# Patient Record
Sex: Female | Born: 1980 | Race: White | Hispanic: No | Marital: Married | Smoking: Never smoker
Health system: Southern US, Community
[De-identification: ages and names within clinical notes are randomized; demographics above are authoritative.]

## PROBLEM LIST (undated history)

## (undated) DIAGNOSIS — K279 Peptic ulcer, site unspecified, unspecified as acute or chronic, without hemorrhage or perforation: Secondary | ICD-10-CM

## (undated) HISTORY — PX: TYMPANOSTOMY TUBE PLACEMENT: SHX32

## (undated) HISTORY — PX: TONSILECTOMY/ADENOIDECTOMY WITH MYRINGOTOMY: SHX6125

## (undated) HISTORY — PX: TONSILLECTOMY: SUR1361

## (undated) HISTORY — PX: TUBAL LIGATION: SHX77

---

## 2006-03-14 ENCOUNTER — Emergency Department (HOSPITAL_COMMUNITY): Admission: EM | Admit: 2006-03-14 | Discharge: 2006-03-14 | Payer: Self-pay | Admitting: Emergency Medicine

## 2006-08-27 ENCOUNTER — Inpatient Hospital Stay (HOSPITAL_COMMUNITY): Admission: AD | Admit: 2006-08-27 | Discharge: 2006-09-01 | Payer: Self-pay | Admitting: *Deleted

## 2006-08-27 ENCOUNTER — Emergency Department (HOSPITAL_COMMUNITY): Admission: EM | Admit: 2006-08-27 | Discharge: 2006-08-27 | Payer: Self-pay | Admitting: Emergency Medicine

## 2006-08-28 ENCOUNTER — Ambulatory Visit: Payer: Self-pay | Admitting: *Deleted

## 2007-11-21 ENCOUNTER — Emergency Department (HOSPITAL_COMMUNITY): Admission: EM | Admit: 2007-11-21 | Discharge: 2007-11-21 | Payer: Self-pay | Admitting: Emergency Medicine

## 2008-07-26 ENCOUNTER — Other Ambulatory Visit: Payer: Self-pay

## 2008-07-26 ENCOUNTER — Other Ambulatory Visit: Payer: Self-pay | Admitting: Emergency Medicine

## 2008-07-27 ENCOUNTER — Other Ambulatory Visit: Payer: Self-pay | Admitting: Emergency Medicine

## 2008-07-27 ENCOUNTER — Ambulatory Visit: Payer: Self-pay | Admitting: Psychiatry

## 2008-07-28 ENCOUNTER — Inpatient Hospital Stay (HOSPITAL_COMMUNITY): Admission: RE | Admit: 2008-07-28 | Discharge: 2008-07-30 | Payer: Self-pay | Admitting: Psychiatry

## 2008-09-12 ENCOUNTER — Other Ambulatory Visit (HOSPITAL_COMMUNITY): Payer: Self-pay | Admitting: Emergency Medicine

## 2008-09-12 ENCOUNTER — Inpatient Hospital Stay (HOSPITAL_COMMUNITY): Admission: AD | Admit: 2008-09-12 | Discharge: 2008-09-14 | Payer: Self-pay | Admitting: Psychiatry

## 2008-09-12 ENCOUNTER — Ambulatory Visit: Payer: Self-pay | Admitting: Psychiatry

## 2008-09-20 ENCOUNTER — Other Ambulatory Visit: Payer: Self-pay

## 2008-09-21 ENCOUNTER — Inpatient Hospital Stay (HOSPITAL_COMMUNITY): Admission: EM | Admit: 2008-09-21 | Discharge: 2008-09-23 | Payer: Self-pay | Admitting: Psychiatry

## 2008-10-23 ENCOUNTER — Emergency Department (HOSPITAL_COMMUNITY): Admission: EM | Admit: 2008-10-23 | Discharge: 2008-10-23 | Payer: Self-pay | Admitting: Emergency Medicine

## 2009-10-19 ENCOUNTER — Emergency Department: Payer: Self-pay | Admitting: Emergency Medicine

## 2009-10-21 ENCOUNTER — Emergency Department: Payer: Self-pay | Admitting: Emergency Medicine

## 2009-12-15 ENCOUNTER — Emergency Department: Payer: Self-pay | Admitting: Emergency Medicine

## 2010-03-31 ENCOUNTER — Emergency Department: Payer: Self-pay | Admitting: Internal Medicine

## 2010-04-10 ENCOUNTER — Observation Stay: Payer: Self-pay | Admitting: Internal Medicine

## 2010-04-11 ENCOUNTER — Inpatient Hospital Stay: Payer: Self-pay | Admitting: Psychiatry

## 2010-04-11 LAB — CBC
HCT: 32.8 % — ABNORMAL LOW (ref 36.0–46.0)
Hemoglobin: 11 g/dL — ABNORMAL LOW (ref 12.0–15.0)
MCV: 91.5 fL (ref 78.0–100.0)
RBC: 3.59 MIL/uL — ABNORMAL LOW (ref 3.87–5.11)
WBC: 7.6 10*3/uL (ref 4.0–10.5)

## 2010-04-11 LAB — ETHANOL: Alcohol, Ethyl (B): <5 mg/dL (ref 0–10)

## 2010-04-11 LAB — BASIC METABOLIC PANEL
Chloride: 105 mEq/L (ref 96–112)
GFR calc Af Amer: >60 mL/min (ref >60)
Potassium: 4 mEq/L (ref 3.5–5.1)
Sodium: 140 mEq/L (ref 135–145)

## 2010-04-11 LAB — DIFFERENTIAL
Eosinophils Absolute: 0.1 10*3/uL (ref 0.0–0.7)
Eosinophils Relative: 1 % (ref 0–5)
Lymphocytes Relative: 31 % (ref 12–46)
Lymphs Abs: 2.4 10*3/uL (ref 0.7–4.0)
Monocytes Relative: 7 % (ref 3–12)

## 2010-04-11 LAB — RAPID URINE DRUG SCREEN, HOSP PERFORMED: Cocaine: NOT DETECTED

## 2010-04-12 LAB — HEPATIC FUNCTION PANEL
ALT: 11 U/L (ref 0–35)
AST: 16 U/L (ref 0–37)
Alkaline Phosphatase: 52 U/L (ref 39–117)
Bilirubin, Direct: 0.1 mg/dL (ref 0.0–0.3)
Indirect Bilirubin: 0.6 mg/dL (ref 0.3–0.9)

## 2010-04-12 LAB — DIFFERENTIAL
Basophils Absolute: 0.2 10*3/uL — ABNORMAL HIGH (ref 0.0–0.1)
Basophils Relative: 2 % — ABNORMAL HIGH (ref 0–1)
Eosinophils Absolute: 0.1 10*3/uL (ref 0.0–0.7)
Eosinophils Absolute: 0.1 10*3/uL (ref 0.0–0.7)
Eosinophils Relative: 1 % (ref 0–5)
Eosinophils Relative: 1 % (ref 0–5)
Lymphocytes Relative: 27 % (ref 12–46)
Lymphocytes Relative: 39 % (ref 12–46)
Lymphs Abs: 3 10*3/uL (ref 0.7–4.0)
Monocytes Absolute: 0.4 10*3/uL (ref 0.1–1.0)
Monocytes Relative: 6 % (ref 3–12)

## 2010-04-12 LAB — RAPID URINE DRUG SCREEN, HOSP PERFORMED
Amphetamines: NOT DETECTED
Barbiturates: NOT DETECTED
Barbiturates: NOT DETECTED
Benzodiazepines: POSITIVE — AB
Benzodiazepines: POSITIVE — AB
Tetrahydrocannabinol: POSITIVE — AB

## 2010-04-12 LAB — TRICYCLICS SCREEN, URINE: TCA Scrn: NOT DETECTED

## 2010-04-12 LAB — CBC
HCT: 33.3 % — ABNORMAL LOW (ref 36.0–46.0)
HCT: 33.5 % — ABNORMAL LOW (ref 36.0–46.0)
Hemoglobin: 11.1 g/dL — ABNORMAL LOW (ref 12.0–15.0)
MCHC: 32.9 g/dL (ref 30.0–36.0)
MCV: 91.4 fL (ref 78.0–100.0)
Platelets: 231 10*3/uL (ref 150–400)
Platelets: 244 10*3/uL (ref 150–400)
RBC: 3.66 MIL/uL — ABNORMAL LOW (ref 3.87–5.11)
RDW: 14.5 % (ref 11.5–15.5)
WBC: 7.7 10*3/uL (ref 4.0–10.5)

## 2010-04-12 LAB — BASIC METABOLIC PANEL
BUN: 10 mg/dL (ref 6–23)
CO2: 28 mEq/L (ref 19–32)
Chloride: 105 mEq/L (ref 96–112)
GFR calc Af Amer: >60 mL/min (ref >60)
GFR calc non Af Amer: >60 mL/min (ref >60)
GFR calc non Af Amer: >60 mL/min (ref >60)
Glucose, Bld: 102 mg/dL — ABNORMAL HIGH (ref 70–99)
Potassium: 3.8 mEq/L (ref 3.5–5.1)
Potassium: 4 mEq/L (ref 3.5–5.1)
Sodium: 139 mEq/L (ref 135–145)

## 2010-04-12 LAB — ETHANOL: Alcohol, Ethyl (B): <5 mg/dL (ref 0–10)

## 2010-04-14 LAB — DIFFERENTIAL
Basophils Relative: 0 % (ref 0–1)
Eosinophils Relative: 1 % (ref 0–5)
Lymphocytes Relative: 19 % (ref 12–46)
Lymphs Abs: 1.6 10*3/uL (ref 0.7–4.0)
Monocytes Absolute: 0.5 10*3/uL (ref 0.1–1.0)
Monocytes Absolute: 0.5 10*3/uL (ref 0.1–1.0)
Monocytes Relative: 6 % (ref 3–12)
Monocytes Relative: 5 % (ref 3–12)
Neutro Abs: 6.4 10*3/uL (ref 1.7–7.7)
Neutro Abs: 9 10*3/uL — ABNORMAL HIGH (ref 1.7–7.7)
Neutrophils Relative %: 75 % (ref 43–77)

## 2010-04-14 LAB — RAPID URINE DRUG SCREEN, HOSP PERFORMED
Cocaine: NOT DETECTED
Cocaine: NOT DETECTED
Opiates: POSITIVE — AB
Tetrahydrocannabinol: POSITIVE — AB
Tetrahydrocannabinol: POSITIVE — AB

## 2010-04-14 LAB — CBC
HCT: 34.2 % — ABNORMAL LOW (ref 36.0–46.0)
Hemoglobin: 11.7 g/dL — ABNORMAL LOW (ref 12.0–15.0)
Hemoglobin: 11.1 g/dL — ABNORMAL LOW (ref 12.0–15.0)
MCHC: 32.3 g/dL (ref 30.0–36.0)
MCV: 91.1 fL (ref 78.0–100.0)
RBC: 3.85 MIL/uL — ABNORMAL LOW (ref 3.87–5.11)
RBC: 3.75 MIL/uL — ABNORMAL LOW (ref 3.87–5.11)
WBC: 8.6 10*3/uL (ref 4.0–10.5)

## 2010-04-14 LAB — BASIC METABOLIC PANEL
CO2: 26 mEq/L (ref 19–32)
Calcium: 9.5 mg/dL (ref 8.4–10.5)
Calcium: 9.1 mg/dL (ref 8.4–10.5)
Chloride: 103 mEq/L (ref 96–112)
Chloride: 103 mEq/L (ref 96–112)
Creatinine, Ser: 0.44 mg/dL (ref 0.4–1.2)
GFR calc Af Amer: >60 mL/min (ref >60)
GFR calc Af Amer: >60 mL/min (ref >60)
Potassium: 4.5 mEq/L (ref 3.5–5.1)
Sodium: 138 mEq/L (ref 135–145)
Sodium: 136 mEq/L (ref 135–145)

## 2010-04-14 LAB — TRICYCLICS SCREEN, URINE: TCA Scrn: NOT DETECTED

## 2010-04-14 LAB — ETHANOL: Alcohol, Ethyl (B): <5 mg/dL (ref 0–10)

## 2010-05-21 NOTE — H&P (Signed)
[name], [name]                ACCOUNT NO.:  [id number]   MEDICAL RECORD NO.:  [id number]          PATIENT TYPE:  IPS   LOCATION:  0504                          FACILITY:  [facility]   PHYSICIAN:  Geoffery Lyons, M.D.      DATE OF BIRTH:  [birth year]   DATE OF ADMISSION:  07/27/2008  DATE OF DISCHARGE:                       PSYCHIATRIC ADMISSION ASSESSMENT   TIME:  1410 p.m.   IDENTIFYING INFORMATION:  A 30 year old female, married.  This is a  voluntary admission.   HISTORY OF PRESENT ILLNESS:  Second [facility] admission for this 30 year old  who requests detox from opiates.  She reports getting involved in taking  opiate medication by snorting pills and taking them orally about 4 years  ago as a social activity.  She felt like she could not stop using them  and needed more and more to get the same effect.  Has been using fairly  steady in some fashion for the past 2-3 years.  Most recently, she is  using two 25 mg fentanyl patches which she will wear for 3 days at a  time and using these for the past 5 months.  In addition to the patches,  she will typically snort 10-15 tablets of Percocet and/or Vicodin  most  days of the week.  On some days when she has bad cravings, will use a  little bit more than 20 tablets.  She denies current or past abuse of  alcohol, benzodiazepines,  tobacco or cocaine.  No history of IV drug  abuse.  She denies suicidal thoughts.  Motivated to be abstinent to be a  better mother to her 2-month-old daughter.  No homicidal thoughts.   PAST PSYCHIATRIC HISTORY:  Second [facility] admission.  One prior admission  August 27, 2006 to September 01, 2006 after she took an overdose of 28  tablets of 25 mg hydroxyzine.  Angry and upset.  At that time, she was  treated with Depakote ER 250 mg in the morning and at bedtime, and  referred to [facility] for followup.  At  that time, she also endorsed abusing benzodiazepines using 10-30 mg per  day.  At that  time, had also been abusing Percocet.  She revealed an  additional suicide attempt by overdose at age 35.  No current outpatient  care.   SOCIAL HISTORY:  Married 6 years, has a 56-month-old daughter named  Preston.  Currently living in [place].  Has Medicaid  resources to pay for care.  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  Husband is nondrinker, nonsmoker.  No history of abuse.  She plans to remain home with her daughter and avoid previous social  contacts to reinforce abstinence.  No legal problems.   FAMILY HISTORY:  Mother with history of cannabis abuse.  Father with  history of cocaine abuse.   ALCOHOL/DRUG HISTORY:  Denies any past history of alcohol abuse.  She is  a nonsmoker.   MEDICAL HISTORY:  Followed at [facility].  Medical problems are none.   PAST MEDICAL HISTORY:  1. Significant for C. section.  2. Two prior suicide attempts by overdose.  3. Bilateral tubal ligation.   CURRENT MEDICATIONS:  None.   DRUG ALLERGIES:  NONE.   PHYSICAL EXAMINATION:  Done in the emergency room and is noted in the  record.  She was in our emergency room from July 25, 2008 to July 27, 2008 waiting bed placement.   LABORATORY DATA:  Urine drug screen positive for benzodiazepines,  opiates and marijuana.  Basic chemistry is normal.  BUN 6, creatinine  0.59.  Alcohol level less than 5.  CBC:  WBC 11.3, hemoglobin 11.1,  hematocrit 34.2, platelets 277,000 and MCV 91.9.  Urine pregnancy test  negative.   MENTAL STATUS EXAM:  Reveals a fully alert female, tearful, anxious in  appearance, but fully coherent.  Good eye contact.  Speech is normal.  Gives a coherent history.  Thought process is logical.  Mood is anxious.  No active suicidal thoughts.  Oriented x4.  No thought disorder.  No  evidence of internal distractions.  Cognitively, she is completely  intact.  Memory is intact.  Has made arrangements with her husband to   reduce the relapse triggers, getting rid of the job.  Wants him to come  to a family session.  Wants to pursue outpatient counseling and be  active in NA.   AXIS I:  Opiate abuse.  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  Opiate abuse rule out dependence.  Polysubstance abuse.  Axis II.  No diagnosis.  AXIS III:  No diagnosis.  AXIS IV:  Severe issues with the social environment.  AXIS V:  Current 46, past year not known.   PLAN:  The plan is to voluntarily admit her to our dual diagnosis unit.  To be on the safe side, we are going to also place her on a Librium  protocol to safely step her down from benzodiazepines.  She has a  history of dependence on these.  Also on a clonidine protocol to safely  detox her from the opiates.  She is anxious to get a family session with  her husband scheduled and we will do that today.  We will check liver  enzymes and a TSH.      Margaret A. Scott, N.P.      Geoffery Lyons, M.D.  Electronically Signed    MAS/MEDQ  D:  07/28/2008  T:  07/28/2008  Job:  [id number]

## 2010-05-21 NOTE — H&P (Signed)
[name], [name]                ACCOUNT NO.:  [id number]   MEDICAL RECORD NO.:  [id number]          PATIENT TYPE:  IPS   LOCATION:  0604                          FACILITY:  [facility]   PHYSICIAN:  Jasmine Pang, M.D. DATE OF BIRTH:  [birth year]   DATE OF ADMISSION:  08/27/2006  DATE OF DISCHARGE:                       PSYCHIATRIC ADMISSION ASSESSMENT   IDENTIFICATION:  This is a 30 year old white female who is married.  This is an involuntary admission.   HISTORY OF PRESENT ILLNESS:  First inpatient psychiatric admission for  this married 30 year old who presented in the emergency room after  taking 28 tablets of 25 mg hydroxyzine.  She said that she was angry and  upset and wanted to herself at the time.  Took the overdose about 2  o'clock in the afternoon.  These are medications that her husband uses  to help with sleep.  She reports that she had been upset and agitated  over her relationship with her mother, mother not speaking to her, has  been having a general chronic discord with her mother and her sisters  who tend to be chronically aloof and angry with the patient.  The  patient herself reports a history of mood swings with anger, decreased  frustration tolerance, getting irritable for periods that last as long  as a day or longer, having suicidal thoughts on and off for the past  three months and having suicidal thoughts occurring as early in life as  when she was a teenager at which time she would frequently write suicide  nodes and had frequent thoughts of suicide.  She currently reports that  she has been abusing Valium now for about three months, usually a 10 mg  tablet, using up to 1-3 tablets most days of the week, crushing and  snorting them.  Last use two days ago.  Also abusing Percocet about 5-9  tablets most days of the week, also crushing and snorting those along  with the Valium.  Was introduced to these by a friend of hers and has  been taking them to calm down.   She denies hallucinations.  Denies any  homicidal thoughts.  Denies abuse of alcohol or other substances.   PAST PSYCHIATRIC HISTORY:  First inpatient psychiatric admission.  The  patient does have a history of prior suicide attempt by overdose at age  84.  Was treated in the emergency room at that time and discharged after  signing a safety contract.  History of suicidal thoughts since teen.  Also had issues with anger and fighting as a child and spontaneous anger  as a teenager.  At that time, she was never treated with medications but  did receive counseling for anger and agitation.  She denies any history  of brain injury, blackout, coma or amnesia.  Denies learning  disabilities.   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  She is  currently  married for the past six years.  No children.  She is the  primary bread winner in the family and her husband is handicapped and  receives a disability check.  The patient reports that her relationships  with the husband's family are good and sees more of them than of her own  family.  No legal charges.   FAMILY HISTORY:  Family history is remarkable for two sisters, one with  alcoholism, one with bipolar disorder.  Mother with a history of bipolar  disorder, currently under treatment.   ALCOHOL/DRUG HISTORY:  Noted above.   MEDICAL HISTORY:  The patient has no regular primary care Sherleen Pangborn.  Medical problems are none.   MEDICATIONS:  None.   ALLERGIES:  None.   POSITIVE PHYSICAL FINDINGS:  The patient's full physical exam was done  in the emergency room at [facility] and is noted there.  She  remained fully conscious after the overdose and was transported by her  husband to the emergency room on presentation there.  Noted physical  findings today are  irregular heart rate and the patient herself denies  any palpitations, shortness of breath or post nocturnal dyspnea.  On  presentation, well-nourished, well-developed female in no distress, slim  build, appears healthy, afebrile, pulse 93, respirations 23, blood  pressure 113/74.   LABORATORY DATA:  CBC revealed WBC 6.8, hemoglobin 11.5, hematocrit 34.2  and platelets 206,000.  MCV 85.7.  Her urine drug screen was positive  for benzodiazepines.  Negative for all other substances.  Urine  pregnancy test negative.  Routine urinalysis unremarkable.  Alcohol  level less than 5.  Her chemistries revealed sodium 138, potassium 3.8,  chloride 111, carbon dioxide 24, BUN 9, creatinine 0.54 and random  glucose 103.  Liver enzymes and TSH are currently pending.  Her  acetaminophen level was less than 10.  Salicylate level less than 4.  EKG noted a slight prolongation but normal sinus rhythm.   MENTAL STATUS EXAM:  Today, fully alert female, cooperative.  Hygiene  and dress are appropriate.  Affect is quite blunted with some flattening  and minimal speech production but otherwise normal tone and pace,  relevant, appropriate.  She is able to express herself.  Clearly  articulate about her concerns particularly her depressed mood and  frequency of suicidal thoughts.  Has never been treated for this and is  definitely interested in treatment, feels that the suicidal thoughts and  mood swings have plagued her since childhood and is anxious for some  relief.  Thought process logical and coherent.  No evidence of  psychosis.  Positive suicidal ideations of poisoning or overdosing  herself.  No homicidal thought.  Cognition is well-preserved.  Calculation and concentration are within normal limits.  Insight  adequate.   DIAGNOSES:  AXIS I:  Mood disorder not otherwise specified.  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  Opiate abuse; rule out  dependence.  AXIS II:  Deferred.  AXIS III:  Status  post hydroxyzine overdose.  AXIS IV:  Moderate (chronic conflict with mother and sisters, having is  stable home and supportive husband is an asset to her).  AXIS V:  Current 29; past year not known.   PLAN:  To involuntarily admit the patient.  We have placed her in a dual  diagnosis program.  She has been cooperative with staff and peers.  We  are going to start her on Depakote ER 500 mg h.s. p.r.n. insomnia,  trazodone 50 mg h.s. p.r.n.  insomnia, Librium protocol to detox from the  benzodiazepines and a clonidine protocol for detox  from the Percocet.  Meanwhile, we are going to check hepatic function  panel and TSH.  The patient is in agreement with the plan.  She has no  history of seizures.   ESTIMATED LENGTH OF STAY:  Five to seven days.      Margaret A. Lorin Picket, N.P.      Jasmine Pang, M.D.  Electronically Signed    MAS/MEDQ  D:  08/28/2006  T:  08/29/2006  Job:  [id number]
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
## 2010-05-24 NOTE — Discharge Summary (Signed)
[name], [name]                ACCOUNT NO.:  [id number]   MEDICAL RECORD NO.:  [id number]          PATIENT TYPE:  IPS   LOCATION:  0504                          FACILITY:  [facility]   PHYSICIAN:  Geoffery Lyons, M.D.      DATE OF BIRTH:  March 27, 1980   DATE OF ADMISSION:  07/28/2008  DATE OF DISCHARGE:  07/30/2008                               DISCHARGE SUMMARY   CHIEF COMPLAINT AND PRESENT ILLNESS:  This was the 2nd admission to  [facility] for this 30 year old, married female  voluntarily admitted.  She requests detox from opiates.  She reports she  had gotten involved in taking opiates by snorting them and taking them  orally about 4 years ago as a social activity.  She felt like she could  not stop using them and needed more and more to get the same effect.  She has been using fairly steady for the past 2 or 3 years, most  recently using two 25 mg fentanyl patches which she will wear for 3 days  at time and she will snort 10 to 15 tablets of Percocet and/or Vicodin  most days of the week.  Sometimes she uses a little bit more than 20  tablets.  Was motivated to be abstinent to be a better mother to her 84-  month-old daughter.   PAST PSYCHIATRIC HISTORY:  Second time at [facility], one prior  admission August 27, 2006, to September 01, 2006.  At that time, she took  an overdose of 28 capsules of hydroxyzine 25 mg.  At that time, she was  treated with Depakote and referred to [facility].  She was also in 2008 abusing benzodiazepines, also abusing  Percocet.  An additional suicide attempt by overdose age 89.   ALCOHOL AND DRUG HISTORY:  As already stated, persistent use of opiates.   MEDICAL HISTORY:  Noncontributory.   MEDICATIONS:  None prescribed.   PHYSICAL EXAM:  Failed to show any acute findings.   LABORATORY WORKUP:  UDS positive for benzodiazepines, opiates, and  marijuana.  BUN 6, creatinine 0.59.  CBC, white blood cells  11.3,  hemoglobin 11.1, mean corpuscular volume 91.9.   MENTAL STATUS EXAM:  Reveals a fully alert, cooperative female.  Mood  depressed.  Affect depressed.  Anxious, tearful.  Good eye contact.  Speech is normal rate, tempo, and production.  Mood anxious, depressed.  Affect anxious, depressed.  Thought processes logical, coherent, and  relevant.  No delusions.  No hallucinations.  Cognition well preserved.   ADMITTING DIAGNOSES:  AXIS I:  1. Opiate abuse, rule out dependence.  2. Benzodiazepine and marijuana abuse,  3. Mood disorder, not otherwise specified.  AXIS II:  No diagnosis.  AXIS III:  No diagnosis.  AXIS IV:  Moderate.  AXIS V:  Upon admission, 35.  Highest Global Assessment of Functioning  in the last year 60.   COURSE IN THE HOSPITAL:  She was admitted, started individual and group  psychotherapy.  We detoxed with clonidine and Librium.  As already  stated, she endorsed she  got dependent on opiates, cannot explain why,  she just did.  Has been using couple of years, very severe withdrawal  when she is not using, wants to be detoxed.  July 29, 2008, detox was  going well.  We scheduled a family session with the husband.  They  expressed concerns about relapsing when she experiences pain and if she  were to witness any pill taking activity.  Endorsed she had been totally  consumed with pill taking.  She has been snorting.  This has damaged her  sinus cavities causing her throat to hurt.  Wanted to return to the life  she left prior to becoming addicted to the opiates.  She stated she  wanted to stop being a recluse in her own home.  Wanted to raise her  daughter and be happy.  Overall, the session went well.  On July 30, 2008, she was in full contact with reality.  There were no active  suicidal or homicidal ideas.  No delusions.  No hallucinations.  She was  not exhibiting any acute overt symptoms of withdrawal but was wanting to  finish the clonidine detox protocol  while at home.   DISCHARGE DIAGNOSES:  AXIS I:  1. Opiate dependence.  2. Marijuana, cannabis, and benzodiazepine abuse.  3. Anxiety disorder, not otherwise specified.  4. Mood disorder, not otherwise specified.  AXIS II:  No diagnosis.  AXIS III:  Headaches.  AXIS IV:  Moderate.  AXIS V:  Upon discharge, 50 to 55.   Discharged on:  1. Clonidine detox 0.1 twice a day for 2 days, then 0.1 daily for 2      days, then discontinue.  2. Naproxen 500 mg twice a day.  3. Librium 25 one 3 times a day for a day,  then twice a day for a      day, then one daily for 1 more day and then discontinue.  4. Bentyl 20 mg every 4 hours as needed for cramping.  5. Phenergan 25 every 6 hours as needed for nausea.   FOLLOWUP:  Through [facility].      Geoffery Lyons, M.D.  Electronically Signed     IL/MEDQ  D:  08/28/2008  T:  08/28/2008  Job:  [id number]

## 2010-08-02 ENCOUNTER — Emergency Department: Payer: Self-pay | Admitting: Emergency Medicine

## 2010-10-09 LAB — DIFFERENTIAL
Basophils Relative: 0
Eosinophils Absolute: 0.1
Monocytes Relative: 7
Neutrophils Relative %: 49

## 2010-10-09 LAB — COMPREHENSIVE METABOLIC PANEL
ALT: 10
Alkaline Phosphatase: 58
CO2: 26
GFR calc non Af Amer: >60
Glucose, Bld: 102 — ABNORMAL HIGH
Potassium: 3.8
Sodium: 139

## 2010-10-09 LAB — URINALYSIS, ROUTINE W REFLEX MICROSCOPIC
Bilirubin Urine: NEGATIVE
Ketones, ur: NEGATIVE
Nitrite: NEGATIVE
Specific Gravity, Urine: 1.037 — ABNORMAL HIGH
Urobilinogen, UA: 0.2

## 2010-10-09 LAB — RAPID URINE DRUG SCREEN, HOSP PERFORMED
Opiates: POSITIVE — AB
Tetrahydrocannabinol: POSITIVE — AB

## 2010-10-09 LAB — ETHANOL: Alcohol, Ethyl (B): <5

## 2010-10-09 LAB — CBC
Hemoglobin: 11.8 — ABNORMAL LOW
RBC: 4.13

## 2010-10-09 LAB — PROTIME-INR: Prothrombin Time: 14.2

## 2010-10-09 LAB — ACETAMINOPHEN LEVEL: Acetaminophen (Tylenol), Serum: <10 — ABNORMAL LOW

## 2010-10-09 LAB — POCT PREGNANCY, URINE: Preg Test, Ur: NEGATIVE

## 2010-10-18 LAB — RAPID URINE DRUG SCREEN, HOSP PERFORMED
Benzodiazepines: POSITIVE — AB
Cocaine: NOT DETECTED
Tetrahydrocannabinol: NOT DETECTED

## 2010-10-18 LAB — DIFFERENTIAL
Basophils Absolute: 0
Basophils Relative: 0
Monocytes Absolute: 0.4
Neutro Abs: 4.7
Neutrophils Relative %: 70

## 2010-10-18 LAB — URINALYSIS, ROUTINE W REFLEX MICROSCOPIC
Bilirubin Urine: NEGATIVE
Ketones, ur: NEGATIVE
Nitrite: NEGATIVE
Urobilinogen, UA: 0.2

## 2010-10-18 LAB — CBC
MCHC: 33.6
RDW: 13.7

## 2010-10-18 LAB — BASIC METABOLIC PANEL
CO2: 24
Calcium: 8.8
Creatinine, Ser: 0.54
Glucose, Bld: 103 — ABNORMAL HIGH

## 2010-10-18 LAB — PREGNANCY, URINE: Preg Test, Ur: NEGATIVE

## 2010-10-18 LAB — HEPATIC FUNCTION PANEL
Alkaline Phosphatase: 47
Bilirubin, Direct: <0.1
Total Protein: 6.7

## 2010-10-18 LAB — TSH: TSH: 1.548

## 2011-02-25 ENCOUNTER — Emergency Department (HOSPITAL_BASED_OUTPATIENT_CLINIC_OR_DEPARTMENT_OTHER)
Admission: EM | Admit: 2011-02-25 | Discharge: 2011-02-25 | Disposition: A | Discharge status: Home Health | Payer: Medicaid Other | Attending: Emergency Medicine | Admitting: Emergency Medicine

## 2011-02-25 ENCOUNTER — Encounter (HOSPITAL_BASED_OUTPATIENT_CLINIC_OR_DEPARTMENT_OTHER): Payer: Self-pay

## 2011-02-25 DIAGNOSIS — R05 Cough: Secondary | ICD-10-CM | POA: Insufficient documentation

## 2011-02-25 DIAGNOSIS — R059 Cough, unspecified: Secondary | ICD-10-CM | POA: Insufficient documentation

## 2011-02-25 DIAGNOSIS — R51 Headache: Secondary | ICD-10-CM | POA: Insufficient documentation

## 2011-02-25 DIAGNOSIS — J029 Acute pharyngitis, unspecified: Secondary | ICD-10-CM | POA: Insufficient documentation

## 2011-02-25 MED ORDER — OXYCODONE-ACETAMINOPHEN 5-325 MG PO TABS
1.0000 | ORAL_TABLET | Freq: Once | ORAL | Status: AC
  Administered 2011-02-25: 1 via ORAL
  Filled 2011-02-25: qty 1

## 2011-02-25 MED ORDER — CLINDAMYCIN HCL 150 MG PO CAPS: 150.0000 mg | ORAL_CAPSULE | Freq: Four times a day (QID) | ORAL | Status: AC

## 2011-02-25 MED ORDER — HYDROCODONE-ACETAMINOPHEN 7.5-500 MG/15ML PO SOLN: 15.0000 mL | Freq: Four times a day (QID) | ORAL | Status: AC | PRN

## 2011-02-25 NOTE — ED Notes (Signed)
MD at bedside. 

## 2011-02-25 NOTE — ED Provider Notes (Signed)
History     CSN: [id number]  Arrival date & time 02/25/11  1113   First MD Initiated Contact with Patient 02/25/11 1118      Chief Complaint  Patient presents with  . Sore Throat  . Otalgia  . Headache  . Cough    The history is provided by the patient.  pt with chills, cough, sore throat and left ear pain for 3 days. No fever. No vomiting. No CP or SOB. No recent sick contacts. Throat worse with swallowing. No difficulty breathing. No dental pain. Nothing improves her symptoms. Her pain is moderate. Pain is constant.   History reviewed. No pertinent past medical history.  Past Surgical History  Procedure Date  . Tubal ligation   . Tympanostomy tube placement   . Tonsillectomy     No family history on file.  History  Substance Use Topics  . Smoking status: Never Smoker   . Smokeless tobacco: Never Used  . Alcohol Use: No    OB History    Grav Para Term Preterm Abortions TAB SAB Ect Mult Living                  Review of Systems  All other systems reviewed and are negative.    Allergies  Review of patient's allergies indicates no known allergies.  Home Medications   Current Outpatient Rx  Name Route Sig Dispense Refill  . CLINDAMYCIN HCL 150 MG PO CAPS Oral Take 1 capsule (150 mg total) by mouth every 6 (six) hours. 28 capsule 0  . HYDROCODONE-ACETAMINOPHEN 7.5-500 MG/15ML PO SOLN Oral Take 15 mLs by mouth every 6 (six) hours as needed for pain. 120 mL 0    BP 116/79  Pulse 84  Temp(Src) 98.9 F (37.2 C) (Oral)  Resp 16  Ht 5\' 6"  (1.676 m)  Wt 130 lb (58.968 kg)  BMI 20.98 kg/m2  SpO2 100%  Physical Exam  Nursing note and vitals reviewed. Constitutional: She is oriented to person, place, and time. She appears well-developed and well-nourished. No distress.  HENT:  Head: Normocephalic and atraumatic.       Mild pharyngeal and tonsillar erythema. No exudates. Uvula is midline. No tonsillar swelling. Tolerating secretions. Airway patent. Cerumen  impactions bilaterally. Unable to remove. Unable to visualize TMs  Eyes: EOM are normal.  Neck: Normal range of motion.  Cardiovascular: Normal rate, regular rhythm and normal heart sounds.   Pulmonary/Chest: Effort normal and breath sounds normal.  Abdominal: Soft. She exhibits no distension. There is no tenderness.  Musculoskeletal: Normal range of motion.  Neurological: She is alert and oriented to person, place, and time.  Skin: Skin is warm and dry.  Psychiatric: She has a normal mood and affect. Judgment normal.    ED Course  Procedures (including critical care time)  Labs Reviewed - No data to display No results found.   1. Pharyngitis       MDM  Pharygitis. Will tx as outpatient. Well appearing, nontoxic        Lyanne Co, MD 02/26/11 [phone]

## 2011-02-25 NOTE — ED Notes (Signed)
Pt reports onset of cough, sore throat, headache and left ear pain over the weekend unrelieved after taking OTC medications.

## 2011-02-25 NOTE — Discharge Instructions (Signed)

## 2011-11-01 ENCOUNTER — Encounter (HOSPITAL_BASED_OUTPATIENT_CLINIC_OR_DEPARTMENT_OTHER): Payer: Self-pay | Admitting: *Deleted

## 2011-11-01 ENCOUNTER — Emergency Department (HOSPITAL_BASED_OUTPATIENT_CLINIC_OR_DEPARTMENT_OTHER): Payer: Medicaid Other

## 2011-11-01 ENCOUNTER — Emergency Department (HOSPITAL_BASED_OUTPATIENT_CLINIC_OR_DEPARTMENT_OTHER)
Admission: EM | Admit: 2011-11-01 | Discharge: 2011-11-01 | Disposition: A | Discharge status: Home / Self Care | Payer: Medicaid Other | Attending: Emergency Medicine | Admitting: Emergency Medicine

## 2011-11-01 DIAGNOSIS — R109 Unspecified abdominal pain: Secondary | ICD-10-CM | POA: Insufficient documentation

## 2011-11-01 DIAGNOSIS — Z9851 Tubal ligation status: Secondary | ICD-10-CM | POA: Insufficient documentation

## 2011-11-01 DIAGNOSIS — K5900 Constipation, unspecified: Secondary | ICD-10-CM | POA: Insufficient documentation

## 2011-11-01 DIAGNOSIS — M549 Dorsalgia, unspecified: Secondary | ICD-10-CM | POA: Insufficient documentation

## 2011-11-01 LAB — COMPREHENSIVE METABOLIC PANEL
ALT: 9 U/L (ref 0–35)
AST: 14 U/L (ref 0–37)
Calcium: 9.8 mg/dL (ref 8.4–10.5)
Creatinine, Ser: 0.7 mg/dL (ref 0.50–1.10)
Sodium: 138 mEq/L (ref 135–145)
Total Protein: 8 g/dL (ref 6.0–8.3)

## 2011-11-01 LAB — CBC WITH DIFFERENTIAL/PLATELET
Basophils Absolute: 0 10*3/uL (ref 0.0–0.1)
Basophils Relative: 0 % (ref 0–1)
Eosinophils Absolute: 0.1 10*3/uL (ref 0.0–0.7)
Eosinophils Relative: 1 % (ref 0–5)
MCH: 27.5 pg (ref 26.0–34.0)
MCHC: 33.2 g/dL (ref 30.0–36.0)
MCV: 82.9 fL (ref 78.0–100.0)
Monocytes Absolute: 0.5 10*3/uL (ref 0.1–1.0)
Platelets: 248 10*3/uL (ref 150–400)
RDW: 12.8 % (ref 11.5–15.5)
WBC: 8.4 10*3/uL (ref 4.0–10.5)

## 2011-11-01 LAB — URINALYSIS, ROUTINE W REFLEX MICROSCOPIC
Bilirubin Urine: NEGATIVE
Hgb urine dipstick: NEGATIVE
Ketones, ur: NEGATIVE mg/dL
Protein, ur: NEGATIVE mg/dL
Urobilinogen, UA: 0.2 mg/dL (ref 0.0–1.0)

## 2011-11-01 MED ORDER — HYDROMORPHONE HCL PF 1 MG/ML IJ SOLN
0.5000 mg | Freq: Once | INTRAMUSCULAR | Status: AC
  Administered 2011-11-01: 0.5 mg via INTRAVENOUS
  Filled 2011-11-01: qty 1

## 2011-11-01 MED ORDER — SODIUM CHLORIDE 0.9 % IV SOLN
Freq: Once | INTRAVENOUS | Status: AC
  Administered 2011-11-01: 22:00:00 via INTRAVENOUS

## 2011-11-01 MED ORDER — ONDANSETRON HCL 4 MG/2ML IJ SOLN
4.0000 mg | Freq: Once | INTRAMUSCULAR | Status: AC
  Administered 2011-11-01: 4 mg via INTRAVENOUS
  Filled 2011-11-01: qty 2

## 2011-11-01 MED ORDER — POLYETHYLENE GLYCOL 3350 17 G PO PACK: 17.0000 g | PACK | Freq: Every day | ORAL | Status: DC

## 2011-11-01 NOTE — ED Provider Notes (Signed)
Medical screening examination/treatment/procedure(s) were performed by non-physician practitioner and as supervising physician I was immediately available for consultation/collaboration.   Wrenn Willcox, MD 11/01/11 2307 

## 2011-11-01 NOTE — ED Notes (Signed)
Patient C/O right upper quadrant pain that has been intermittent for the past year.  Patient states that the pain returned this  AM and has not gone away like it usually does.  Pain has been at a constant level through out the day. Pain is 6/10. RUQ is tender to palpation.  Denies nausea, vomiting, diarrhea and urinary symptoms.  Patient states that she has lost her appetite and has not eaten much.  States that taking a deep breath makes it hurt worse.  Patient also C/O being constipated recently.  Last BM was Monday.

## 2011-11-01 NOTE — ED Notes (Signed)
Pt reports right side upper abd pain that goes into her back reports that she has been constipated LNBM 4-5 days ago denies N/V fever or urinary sx

## 2011-11-01 NOTE — ED Provider Notes (Signed)
History     CSN: [id number]  Arrival date & time 11/01/11  1846   First MD Initiated Contact with Patient 11/01/11 2020      Chief Complaint  Patient presents with  . Abdominal Pain  . Back Pain    (Consider location/radiation/quality/duration/timing/severity/associated sxs/prior treatment) Patient is a 31 y.o. female presenting with abdominal pain and back pain. The history is provided by the patient.  Abdominal Pain The primary symptoms of the illness include abdominal pain. The primary symptoms of the illness do not include fever, shortness of breath, nausea, vomiting or dysuria. The current episode started more than 2 days ago.  Additional symptoms associated with the illness include constipation and back pain. Associated symptoms comments: RUQ abdominal pain for several days without N, V or fever. She reports having similar pain in same location off and on for one year. No worse or better with eating. She states she has been having a problem with constipation with last bowel movement 4-5 days ago..  Back Pain  Associated symptoms include abdominal pain. Pertinent negatives include no chest pain, no fever and no dysuria.    History reviewed. No pertinent past medical history.  Past Surgical History  Procedure Date  . Tubal ligation   . Tympanostomy tube placement   . Tonsillectomy     No family history on file.  History  Substance Use Topics  . Smoking status: Never Smoker   . Smokeless tobacco: Never Used  . Alcohol Use: No    OB History    Grav Para Term Preterm Abortions TAB SAB Ect Mult Living   1 1        1       Review of Systems  Constitutional: Negative for fever.  Respiratory: Negative for shortness of breath.   Cardiovascular: Negative for chest pain.  Gastrointestinal: Positive for abdominal pain and constipation. Negative for nausea and vomiting.  Genitourinary: Negative for dysuria.  Musculoskeletal: Positive for back pain.    Allergies    Review of patient's allergies indicates no known allergies.  Home Medications  No current outpatient prescriptions on file.  BP 131/81  Pulse 99  Temp 98.6 F (37 C)  Resp 20  SpO2 100%  LMP 10/18/2011  Physical Exam  Constitutional: She is oriented to person, place, and time. She appears well-developed and well-nourished.  HENT:  Head: Normocephalic.  Neck: Normal range of motion. Neck supple.  Cardiovascular: Normal rate and regular rhythm.   No murmur heard. Pulmonary/Chest: Effort normal and breath sounds normal.  Abdominal: Soft. Bowel sounds are normal. There is tenderness. There is no rebound and no guarding.       RUQ tenderness without rebound or guarding.  Musculoskeletal: Normal range of motion.  Neurological: She is alert and oriented to person, place, and time.  Skin: Skin is warm and dry. No rash noted.  Psychiatric: She has a normal mood and affect.    ED Course  Procedures (including critical care time)  Labs Reviewed  URINALYSIS, ROUTINE W REFLEX MICROSCOPIC - Abnormal; Notable for the following:    APPearance CLOUDY (*)     All other components within normal limits  CBC WITH DIFFERENTIAL  COMPREHENSIVE METABOLIC PANEL  LIPASE, BLOOD   Results for orders placed during the hospital encounter of 11/01/11  URINALYSIS, ROUTINE W REFLEX MICROSCOPIC      Component Value Range   Color, Urine YELLOW  YELLOW   APPearance CLOUDY (*) CLEAR   Specific Gravity, Urine 1.017  1.005 -  1.030   pH 7.0  5.0 - 8.0   Glucose, UA NEGATIVE  NEGATIVE mg/dL   Hgb urine dipstick NEGATIVE  NEGATIVE   Bilirubin Urine NEGATIVE  NEGATIVE   Ketones, ur NEGATIVE  NEGATIVE mg/dL   Protein, ur NEGATIVE  NEGATIVE mg/dL   Urobilinogen, UA 0.2  0.0 - 1.0 mg/dL   Nitrite NEGATIVE  NEGATIVE   Leukocytes, UA NEGATIVE  NEGATIVE  CBC WITH DIFFERENTIAL      Component Value Range   WBC 8.4  4.0 - 10.5 K/uL   RBC 4.62  3.87 - 5.11 MIL/uL   Hemoglobin 12.7  12.0 - 15.0 g/dL   HCT  16.1  09.6 - 04.5 %   MCV 82.9  78.0 - 100.0 fL   MCH 27.5  26.0 - 34.0 pg   MCHC 33.2  30.0 - 36.0 g/dL   RDW 40.9  81.1 - 91.4 %   Platelets 248  150 - 400 K/uL   Neutrophils Relative 59  43 - 77 %   Neutro Abs 5.0  1.7 - 7.7 K/uL   Lymphocytes Relative 35  12 - 46 %   Lymphs Abs 2.9  0.7 - 4.0 K/uL   Monocytes Relative 5  3 - 12 %   Monocytes Absolute 0.5  0.1 - 1.0 K/uL   Eosinophils Relative 1  0 - 5 %   Eosinophils Absolute 0.1  0.0 - 0.7 K/uL   Basophils Relative 0  0 - 1 %   Basophils Absolute 0.0  0.0 - 0.1 K/uL  COMPREHENSIVE METABOLIC PANEL      Component Value Range   Sodium 138  135 - 145 mEq/L   Potassium 3.6  3.5 - 5.1 mEq/L   Chloride 101  96 - 112 mEq/L   CO2 24  19 - 32 mEq/L   Glucose, Bld 107 (*) 70 - 99 mg/dL   BUN 8  6 - 23 mg/dL   Creatinine, Ser 7.82  0.50 - 1.10 mg/dL   Calcium 9.8  8.4 - 95.6 mg/dL   Total Protein 8.0  6.0 - 8.3 g/dL   Albumin 4.6  3.5 - 5.2 g/dL   AST 14  0 - 37 U/L   ALT 9  0 - 35 U/L   Alkaline Phosphatase 51  39 - 117 U/L   Total Bilirubin 0.2 (*) 0.3 - 1.2 mg/dL   GFR calc non Af Amer >90  >90 mL/min   GFR calc Af Amer >90  >90 mL/min  LIPASE, BLOOD      Component Value Range   Lipase 15  11 - 59 U/L   US Abdomen Complete  11/01/2011  *RADIOLOGY REPORT*  Clinical Data:  Intermittent right upper quadrant abdominal pain  COMPLETE ABDOMINAL ULTRASOUND  Comparison:  None.  Findings:  Gallbladder:  No gallstones, gallbladder wall thickening, or pericholecystic fluid.  Negative sonographic Murphy's sign.  Common bile duct:  Measures 4 mm.  Liver:  No focal lesion identified.  Within normal limits in parenchymal echogenicity.  IVC:  Appears normal.  Pancreas:  Visualized portions are within normal limits.  Spleen:  Measures 10.6 cm.  Right Kidney:  Measures 11.3 cm.  No mass or hydronephrosis.  Left Kidney:  Measures 11.8 cm.  No mass or hydronephrosis.  Abdominal aorta:  No aneurysm identified.  IMPRESSION: Negative abdominal  ultrasound.   Original Report Authenticated By: Charline Bills, M.D.    No results found.   No diagnosis found. 1. Abdominal pain 2. Constipation  MDM  Pain off and on for extended period in RUQ abdomen. Ultrasound done to evaluate gall bladder and is negative. The patient has given additional history of pain correlating with episodes of constipation. Normal labs, stable vitals. Will treat with Miralax and recommend PCP follow up.        Rodena Medin, PA-C 11/01/11 2303

## 2013-11-07 ENCOUNTER — Encounter (HOSPITAL_BASED_OUTPATIENT_CLINIC_OR_DEPARTMENT_OTHER): Payer: Self-pay | Admitting: *Deleted

## 2014-10-05 ENCOUNTER — Emergency Department
Admission: EM | Admit: 2014-10-05 | Discharge: 2014-10-05 | Disposition: A | Discharge status: Home / Self Care | Payer: Medicaid Other | Attending: Emergency Medicine | Admitting: Emergency Medicine

## 2014-10-05 ENCOUNTER — Emergency Department: Payer: Medicaid Other

## 2014-10-05 DIAGNOSIS — K5900 Constipation, unspecified: Secondary | ICD-10-CM | POA: Insufficient documentation

## 2014-10-05 DIAGNOSIS — Z79899 Other long term (current) drug therapy: Secondary | ICD-10-CM | POA: Diagnosis not present

## 2014-10-05 DIAGNOSIS — Z3202 Encounter for pregnancy test, result negative: Secondary | ICD-10-CM | POA: Insufficient documentation

## 2014-10-05 LAB — POCT PREGNANCY, URINE: Preg Test, Ur: NEGATIVE

## 2014-10-05 MED ORDER — ONDANSETRON HCL 4 MG PO TABS: 4.0000 mg | ORAL_TABLET | Freq: Every day | ORAL | Status: AC | PRN

## 2014-10-05 MED ORDER — LACTULOSE 10 GM/15ML PO SOLN: 20.0000 g | Freq: Every day | ORAL | Status: DC | PRN

## 2014-10-05 NOTE — ED Provider Notes (Signed)
[facility] Emergency Department Provider Note  ____________________________________________  Time seen: Approximately 705 AM  I have reviewed the triage vital signs and the nursing notes.   HISTORY  Chief Complaint Constipation    HPI [name] is a 34 y.o. female with a history of constipation on Suboxone was presenting today without having a bowel movement in 2 weeks. She says she has lower left abdominal cramping and has had several episodes of vomiting. She has been taking multiple medications to help her constipation such as Colace, mag citrate, probiotics and MiraLAX.She says she has not had any relief. She said that she has been on Suboxone for the last 2 years but prior to that still had constipation issues. She says that multiple people in her family had constipation issues and have diverticulosis with resulting colectomies with colostomies. She says she has a doctor's appointment this coming Tuesday with [facility] also is a gastroenterologist. She says that she was post to have a colonoscopy last year but did not go because she was afraid to find out the results.   History reviewed. No pertinent past medical history.  There are no active problems to display for this patient.   Past Surgical History  Procedure Laterality Date  . Tubal ligation    . Tympanostomy tube placement    . Tonsillectomy    . Tonsilectomy/adenoidectomy with myringotomy      Current Outpatient Rx  Name  Route  Sig  Dispense  Refill  . polyethylene glycol (MIRALAX) packet   Oral   Take 17 g by mouth daily. Take for maximum 3 consecutive days   14 each   0     Allergies Review of patient's allergies indicates no known allergies.  No family history on file.  Social History Social History  Substance Use Topics  . Smoking status: Never Smoker   . Smokeless tobacco: Never Used  . Alcohol Use: No    Review of  Systems Constitutional: No fever/chills Eyes: No visual changes. ENT: No sore throat. Cardiovascular: Denies chest pain. Respiratory: Denies shortness of breath. Gastrointestinal: No abdominal pain.  No nausea, no vomiting.  No diarrhea.  No constipation. Genitourinary: Negative for dysuria. Musculoskeletal: Negative for back pain. Skin: Negative for rash. Neurological: Negative for headaches, focal weakness or numbness.  10-point ROS otherwise negative.  ____________________________________________   PHYSICAL EXAM:  VITAL SIGNS: ED Triage Vitals  Enc Vitals Group     BP 10/05/14 0609 114/84 mmHg     Pulse --      Resp 10/05/14 0609 20     Temp 10/05/14 0609 98.2 F (36.8 C)     Temp Source 10/05/14 0609 Oral     SpO2 10/05/14 0609 96 %     Weight 10/05/14 0609 145 lb (65.772 kg)     Height 10/05/14 0609  (1.676 m)     Head Cir --      Peak Flow --      Pain Score 10/05/14 0605 7     Pain Loc --      Pain Edu? --      Excl. in GC? --     Constitutional: Alert and oriented. Well appearing and in no acute distress. Eyes: Conjunctivae are normal. PERRL. EOMI. Head: Atraumatic. Nose: No congestion/rhinnorhea. Mouth/Throat: Mucous membranes are moist.  Oropharynx non-erythematous. Neck: No stridor.   Cardiovascular: Normal rate, regular rhythm. Grossly normal heart sounds.  Good peripheral  circulation. Respiratory: Normal respiratory effort.  No retractions. Lungs CTAB. Gastrointestinal: Soft with very mild tenderness to left lower quadrant.. No distention. No abdominal bruits. No CVA tenderness. Rectal exam without fecal impaction. No stool in the rectal vault. Musculoskeletal: No lower extremity tenderness nor edema.  No joint effusions. Neurologic:  Normal speech and language. No gross focal neurologic deficits are appreciated. No gait instability. Skin:  Skin is warm, dry and intact. No rash noted. Psychiatric: Mood and affect are normal. Speech and behavior are  normal.  ____________________________________________   LABS (all labs ordered are listed, but only abnormal results are displayed)  Labs Reviewed - No data to display ____________________________________________  EKG   ____________________________________________  RADIOLOGY  Negative abdominal radiographs. I personally reviewed these films and there does appear to be some stool concentrated in the descending colon. ____________________________________________   PROCEDURES  ____________________________________________   INITIAL IMPRESSION / ASSESSMENT AND PLAN / ED COURSE  Pertinent labs & imaging results that were available during my care of the patient were reviewed by me and considered in my medical decision making (see chart for details).  ----------------------------------------- 9:06 AM on 10/05/2014 -----------------------------------------  No vomiting throughout this visit. The patient has been resting complete this time. Discussed dietary changes such as drinking plenty water as well as eating more fruits and vegetables. Also recommended fiber bars and fiber cereal. I will also give the patient a prescription for lactulose to help with constipation. She has no point in coming up this Tuesday with her primary care doctor. I urged her to follow up with this appointment and to go through with a colonoscopy if recommended. ____________________________________________   FINAL CLINICAL IMPRESSION(S) / ED DIAGNOSES  Final diagnoses:  Constipation      Myrna Blazer, MD 10/05/14 [phone]

## 2014-10-05 NOTE — Discharge Instructions (Signed)

## 2014-10-05 NOTE — ED Notes (Signed)
Patient transported to X-ray 

## 2014-10-05 NOTE — ED Notes (Signed)
Pt lying supine on stretcher in exam room with no distress noted; reports constipation for last 2wks; taking OTC meds without relief; +BS, abd soft/nondist, reports discomfort with palpation to lower abdomen; denies any urinary c/o

## 2014-10-05 NOTE — ED Notes (Signed)
Xray called to transport patient

## 2014-10-05 NOTE — ED Notes (Signed)
POC pregnancy test completed. Result- negative

## 2014-10-05 NOTE — ED Notes (Signed)
Patient to ED for constipation. States she has been taking laxatives, Colace and mag citrate (also probiotics) but still has not had a bowel movement. Patient states she cannot take the pain anymore.

## 2015-07-19 ENCOUNTER — Emergency Department
Admission: EM | Admit: 2015-07-19 | Discharge: 2015-07-19 | Disposition: A | Discharge status: Home / Self Care | Payer: Medicaid Other | Attending: Student | Admitting: Student

## 2015-07-19 ENCOUNTER — Encounter: Payer: Self-pay | Admitting: Emergency Medicine

## 2015-07-19 ENCOUNTER — Emergency Department: Payer: Medicaid Other

## 2015-07-19 DIAGNOSIS — N201 Calculus of ureter: Secondary | ICD-10-CM | POA: Diagnosis not present

## 2015-07-19 DIAGNOSIS — Z9622 Myringotomy tube(s) status: Secondary | ICD-10-CM | POA: Insufficient documentation

## 2015-07-19 DIAGNOSIS — R109 Unspecified abdominal pain: Secondary | ICD-10-CM

## 2015-07-19 DIAGNOSIS — Z79899 Other long term (current) drug therapy: Secondary | ICD-10-CM | POA: Insufficient documentation

## 2015-07-19 HISTORY — DX: Peptic ulcer, site unspecified, unspecified as acute or chronic, without hemorrhage or perforation: K27.9

## 2015-07-19 LAB — URINALYSIS COMPLETE WITH MICROSCOPIC (ARMC ONLY)
Bacteria, UA: NONE SEEN
Bilirubin Urine: NEGATIVE
Glucose, UA: NEGATIVE mg/dL
Leukocytes, UA: NEGATIVE
NITRITE: NEGATIVE
PH: 5 (ref 5.0–8.0)
PROTEIN: NEGATIVE mg/dL
Specific Gravity, Urine: 1.016 (ref 1.005–1.030)

## 2015-07-19 LAB — CBC
HEMATOCRIT: 36.5 % (ref 35.0–47.0)
HEMOGLOBIN: 12.1 g/dL (ref 12.0–16.0)
MCH: 29 pg (ref 26.0–34.0)
MCHC: 33.1 g/dL (ref 32.0–36.0)
MCV: 87.6 fL (ref 80.0–100.0)
Platelets: 198 10*3/uL (ref 150–440)
RBC: 4.17 MIL/uL (ref 3.80–5.20)
RDW: 13.5 % (ref 11.5–14.5)
WBC: 5.9 10*3/uL (ref 3.6–11.0)

## 2015-07-19 LAB — BASIC METABOLIC PANEL
ANION GAP: 7 (ref 5–15)
BUN: 17 mg/dL (ref 6–20)
CO2: 25 mmol/L (ref 22–32)
Calcium: 9.1 mg/dL (ref 8.9–10.3)
Chloride: 105 mmol/L (ref 101–111)
Creatinine, Ser: 0.74 mg/dL (ref 0.44–1.00)
GFR calc Af Amer: >60 mL/min (ref >60)
Glucose, Bld: 113 mg/dL — ABNORMAL HIGH (ref 65–99)
POTASSIUM: 4.1 mmol/L (ref 3.5–5.1)
SODIUM: 137 mmol/L (ref 135–145)

## 2015-07-19 LAB — POCT PREGNANCY, URINE: PREG TEST UR: NEGATIVE

## 2015-07-19 MED ORDER — ONDANSETRON HCL 4 MG/2ML IJ SOLN
4.0000 mg | Freq: Once | INTRAMUSCULAR | Status: AC
  Administered 2015-07-19: 4 mg via INTRAVENOUS
  Filled 2015-07-19: qty 2

## 2015-07-19 MED ORDER — OXYCODONE HCL 5 MG PO TABS: 5.0000 mg | ORAL_TABLET | Freq: Four times a day (QID) | ORAL | Status: AC | PRN

## 2015-07-19 MED ORDER — SODIUM CHLORIDE 0.9 % IV BOLUS (SEPSIS)
1000.0000 mL | Freq: Once | INTRAVENOUS | Status: AC
  Administered 2015-07-19: 1000 mL via INTRAVENOUS

## 2015-07-19 MED ORDER — TAMSULOSIN HCL 0.4 MG PO CAPS: 0.4000 mg | ORAL_CAPSULE | Freq: Every day | ORAL | Status: AC

## 2015-07-19 MED ORDER — KETOROLAC TROMETHAMINE 30 MG/ML IJ SOLN
15.0000 mg | Freq: Once | INTRAMUSCULAR | Status: AC
  Administered 2015-07-19: 15 mg via INTRAVENOUS
  Filled 2015-07-19: qty 1

## 2015-07-19 MED ORDER — ONDANSETRON HCL 4 MG/2ML IJ SOLN
4.0000 mg | Freq: Once | INTRAMUSCULAR | Status: AC | PRN
  Administered 2015-07-19: 4 mg via INTRAVENOUS

## 2015-07-19 MED ORDER — ONDANSETRON 4 MG PO TBDP: 4.0000 mg | ORAL_TABLET | Freq: Three times a day (TID) | ORAL | Status: AC | PRN

## 2015-07-19 MED ORDER — ONDANSETRON HCL 4 MG/2ML IJ SOLN
INTRAMUSCULAR | Status: AC
  Filled 2015-07-19: qty 2

## 2015-07-19 MED ORDER — MORPHINE SULFATE (PF) 2 MG/ML IV SOLN
2.0000 mg | Freq: Once | INTRAVENOUS | Status: AC
  Administered 2015-07-19: 2 mg via INTRAVENOUS
  Filled 2015-07-19: qty 1

## 2015-07-19 NOTE — ED Notes (Signed)
Pt presents to ED with reports of right flank pain that began this morning. Pt reports nausea. Pt denies dysuria.

## 2015-07-19 NOTE — ED Notes (Signed)
Pt transported to CT ?

## 2015-07-19 NOTE — ED Provider Notes (Addendum)
[facility] Emergency Department Provider Note   ____________________________________________  Time seen: Approximately 12:11 PM  I have reviewed the triage vital signs and the nursing notes.   HISTORY  Chief Complaint Flank Pain    HPI CHETARA KROPP is a 35 y.o. female with history of peptic ulcer disease presents for evaluation of sudden onset right flank pain today radiating to the right abdomen, gradual onset, constant, severe, no modifying factors she has had nausea and dry heaves but no vomiting. No diarrhea, no fevers or chills. No chest pain or difficulty breathing. She has not had this pain previously, no history of kidney stones.   Past Medical History  Diagnosis Date  . Peptic ulcer     There are no active problems to display for this patient.   Past Surgical History  Procedure Laterality Date  . Tubal ligation    . Tympanostomy tube placement    . Tonsillectomy    . Tonsilectomy/adenoidectomy with myringotomy      Current Outpatient Rx  Name  Route  Sig  Dispense  Refill  . amphetamine-dextroamphetamine (ADDERALL) 20 MG tablet   Oral   Take 20 mg by mouth daily.         Marland Kitchen ibuprofen (ADVIL,MOTRIN) 600 MG tablet   Oral   Take 600 mg by mouth every 6 (six) hours as needed.         . linaclotide (LINZESS) 145 MCG CAPS capsule   Oral   Take 145 mcg by mouth daily before breakfast.         . omeprazole (PRILOSEC) 40 MG capsule   Oral   Take 40 mg by mouth daily.         . ondansetron (ZOFRAN) 4 MG tablet   Oral   Take 1 tablet (4 mg total) by mouth daily as needed for nausea or vomiting.   10 tablet   1     Allergies Review of patient's allergies indicates no known allergies.  No family history on file.  Social History Social History  Substance Use Topics  . Smoking status: Never Smoker   . Smokeless tobacco: Never Used  . Alcohol Use: No    Review of Systems Constitutional: No fever/chills Eyes: No  visual changes. ENT: No sore throat. Cardiovascular: Denies chest pain. Respiratory: Denies shortness of breath. Gastrointestinal: No abdominal pain.  + nausea, no vomiting.  No diarrhea.  No constipation. Genitourinary: Negative for dysuria. Musculoskeletal: Positive for right flank pain. Skin: Negative for rash. Neurological: Negative for headaches, focal weakness or numbness.  10-point ROS otherwise negative.  ____________________________________________   PHYSICAL EXAM:  Filed Vitals:   07/19/15 1230 07/19/15 1300 07/19/15 1330 07/19/15 1415  BP: 146/93 128/98 121/86 105/73  Pulse: 75 74 64 98  Temp:      TempSrc:      Resp: 16 15 17 18   Height:      Weight:      SpO2: 100% 99% 100% 100%    VITAL SIGNS: ED Triage Vitals  Enc Vitals Group     BP 07/19/15 1139 128/60 mmHg     Pulse Rate 07/19/15 1139 82     Resp 07/19/15 1139 20     Temp 07/19/15 1139 97.7 F (36.5 C)     Temp Source 07/19/15 1139 Oral     SpO2 07/19/15 1139 100 %     Weight 07/19/15 1139 110 lb (49.896 kg)     Height 07/19/15 1139 5\' 6"  (1.676  m)     Head Cir --      Peak Flow --      Pain Score 07/19/15 1140 8     Pain Loc --      Pain Edu? --      Excl. in GC? --     Constitutional: Alert and oriented. In distress secondary to pain.. Eyes: Conjunctivae are normal. PERRL. EOMI. Head: Atraumatic. Nose: No congestion/rhinnorhea. Mouth/Throat: Mucous membranes are moist.  Oropharynx non-erythematous. Neck: No stridor.   Cardiovascular: Normal rate, regular rhythm. Grossly normal heart sounds.  Good peripheral circulation. Respiratory: Normal respiratory effort.  No retractions. Lungs CTAB. Gastrointestinal: Soft with mild tenderness in the right mid abdomen in the right lower quadrant, no rebound or guarding. Mild right CVA tenderness. Genitourinary: Deferred Musculoskeletal: No lower extremity tenderness nor edema.  No joint effusions. Neurologic:  Normal speech and language. No gross  focal neurologic deficits are appreciated. No gait instability. Skin:  Skin is warm, dry and intact. No rash noted. Psychiatric: Mood and affect are normal. Speech and behavior are normal.  ____________________________________________   LABS (all labs ordered are listed, but only abnormal results are displayed)  Labs Reviewed  BASIC METABOLIC PANEL - Abnormal; Notable for the following:    Glucose, Bld 113 (*)    All other components within normal limits  URINALYSIS COMPLETEWITH MICROSCOPIC ([facility] ONLY) - Abnormal; Notable for the following:    Color, Urine YELLOW (*)    APPearance CLEAR (*)    Ketones, ur TRACE (*)    Hgb urine dipstick 3+ (*)    Squamous Epithelial / LPF 0-5 (*)    All other components within normal limits  CBC  POC URINE PREG, ED  POCT PREGNANCY, URINE   ____________________________________________  EKG  none ____________________________________________  RADIOLOGY  CT abdomen and pelvis IMPRESSION: 1.5 mm right UVJ calculus causing mild right hydroureteronephrosis. ____________________________________________   PROCEDURES  Procedure(s) performed: None  Procedures  Critical Care performed: No  ____________________________________________   INITIAL IMPRESSION / ASSESSMENT AND PLAN / ED COURSE  Pertinent labs & imaging results that were available during my care of the patient were reviewed by me and considered in my medical decision making (see chart for details).  KORRI ASK is a 35 y.o. female with history of peptic ulcer disease presents for evaluation of sudden onset right flank pain today radiating to the right abdomen. On arrival, she is in distress secondary to pain however that hasn't improved significantly after Toradol. Her vital signs are stable, she is afebrile. She has mild tenderness in the right mid abdomen, right lower quadrant as well as right CVA tenderness. CBC and BMP unremarkable. Urine pregnancy test is negative,  urinalysis pending. We'll obtain CT of the abdomen and pelvis as her clinical picture is concerning for  kidney Rizo, possibly obstructing. We'll continue to treat her symptomatically and reassess for disposition.  ----------------------------------------- 2:44 PM on 07/19/2015 ----------------------------------------- Urinalysis with too numerous to count red blood cells, minimal white blood cells, negative nitrates and leukocytes does not appear infected, no bacteria. CT scan shows 1.5 mm right UVJ [place]. Patient with significant improvement of her pain, she feels much better tolerating by mouth intake without vomiting. We'll discharge with expectant management. We discussed return precautions, need for close urology follow-up and she is comfortable with the discharge plan. DC home.  ____________________________________________   FINAL CLINICAL IMPRESSION(S) / ED DIAGNOSES  Final diagnoses:  Acute right flank pain  Ureterolithiasis      NEW  MEDICATIONS STARTED DURING THIS VISIT:  New Prescriptions   No medications on file     Note:  This document was prepared using Dragon voice recognition software and may include unintentional dictation errors.    Gayla DossEryka A Westlee Devita, MD 07/19/15 1445  Gayla DossEryka A Rayburn Mundis, MD 07/19/15 [phone]

## 2016-12-28 ENCOUNTER — Other Ambulatory Visit: Payer: Self-pay

## 2016-12-28 ENCOUNTER — Encounter: Payer: Self-pay | Admitting: Emergency Medicine

## 2016-12-28 ENCOUNTER — Emergency Department
Admission: EM | Admit: 2016-12-28 | Discharge: 2016-12-28 | Disposition: A | Discharge status: Home / Self Care | Payer: Medicaid Other | Attending: Emergency Medicine | Admitting: Emergency Medicine

## 2016-12-28 DIAGNOSIS — Z79899 Other long term (current) drug therapy: Secondary | ICD-10-CM | POA: Diagnosis not present

## 2016-12-28 DIAGNOSIS — N200 Calculus of kidney: Secondary | ICD-10-CM | POA: Diagnosis not present

## 2016-12-28 DIAGNOSIS — R1031 Right lower quadrant pain: Secondary | ICD-10-CM | POA: Diagnosis not present

## 2016-12-28 DIAGNOSIS — R109 Unspecified abdominal pain: Secondary | ICD-10-CM

## 2016-12-28 LAB — CBC
HCT: 37.7 % (ref 35.0–47.0)
Hemoglobin: 12.5 g/dL (ref 12.0–16.0)
MCH: 28.4 pg (ref 26.0–34.0)
MCHC: 33.3 g/dL (ref 32.0–36.0)
MCV: 85.5 fL (ref 80.0–100.0)
PLATELETS: 209 10*3/uL (ref 150–440)
RBC: 4.41 MIL/uL (ref 3.80–5.20)
RDW: 13.5 % (ref 11.5–14.5)
WBC: 5.4 10*3/uL (ref 3.6–11.0)

## 2016-12-28 LAB — URINALYSIS, COMPLETE (UACMP) WITH MICROSCOPIC
BILIRUBIN URINE: NEGATIVE
Glucose, UA: NEGATIVE mg/dL
Ketones, ur: NEGATIVE mg/dL
LEUKOCYTES UA: NEGATIVE
NITRITE: NEGATIVE
PH: 6 (ref 5.0–8.0)
Protein, ur: 30 mg/dL — AB
SPECIFIC GRAVITY, URINE: 1.024 (ref 1.005–1.030)

## 2016-12-28 LAB — BASIC METABOLIC PANEL
Anion gap: 6 (ref 5–15)
BUN: 11 mg/dL (ref 6–20)
CHLORIDE: 105 mmol/L (ref 101–111)
CO2: 27 mmol/L (ref 22–32)
CREATININE: 0.82 mg/dL (ref 0.44–1.00)
Calcium: 9.1 mg/dL (ref 8.9–10.3)
GFR calc Af Amer: >60 mL/min (ref >60)
GFR calc non Af Amer: >60 mL/min (ref >60)
GLUCOSE: 97 mg/dL (ref 65–99)
Potassium: 3.6 mmol/L (ref 3.5–5.1)
SODIUM: 138 mmol/L (ref 135–145)

## 2016-12-28 LAB — POCT PREGNANCY, URINE: Preg Test, Ur: NEGATIVE

## 2016-12-28 MED ORDER — KETOROLAC TROMETHAMINE 10 MG PO TABS: 10.0000 mg | ORAL_TABLET | Freq: Three times a day (TID) | ORAL | 0 refills | Status: AC | PRN

## 2016-12-28 MED ORDER — LIDOCAINE HCL (CARDIAC) 20 MG/ML IV SOLN
1.5000 mg/kg | Freq: Once | INTRAVENOUS | Status: DC
  Filled 2016-12-28: qty 10

## 2016-12-28 MED ORDER — SODIUM CHLORIDE 0.9 % IV BOLUS (SEPSIS)
1000.0000 mL | Freq: Once | INTRAVENOUS | Status: AC
  Administered 2016-12-28: 1000 mL via INTRAVENOUS

## 2016-12-28 MED ORDER — SODIUM CHLORIDE 0.9 % IV SOLN
Freq: Once | INTRAVENOUS | Status: DC
  Filled 2016-12-28: qty 100

## 2016-12-28 MED ORDER — LIDOCAINE HCL (CARDIAC) 20 MG/ML IV SOLN: 100.0000 mg | Freq: Once | INTRAVENOUS | Status: DC

## 2016-12-28 MED ORDER — TAMSULOSIN HCL 0.4 MG PO CAPS: 0.4000 mg | ORAL_CAPSULE | Freq: Every day | ORAL | 0 refills | Status: AC

## 2016-12-28 MED ORDER — KETOROLAC TROMETHAMINE 30 MG/ML IJ SOLN
30.0000 mg | Freq: Once | INTRAMUSCULAR | Status: AC
  Administered 2016-12-28: 30 mg via INTRAVENOUS
  Filled 2016-12-28: qty 1

## 2016-12-28 MED ORDER — SODIUM CHLORIDE 0.9 % IV SOLN
Freq: Once | INTRAVENOUS | Status: AC
  Administered 2016-12-28: 20:00:00 via INTRAVENOUS
  Filled 2016-12-28: qty 100

## 2016-12-28 MED ORDER — ONDANSETRON HCL 4 MG PO TABS: 4.0000 mg | ORAL_TABLET | Freq: Three times a day (TID) | ORAL | 0 refills | Status: AC | PRN

## 2016-12-28 NOTE — ED Notes (Signed)
First nurse note  Presents with right flank pain that is radiating into RLQ

## 2016-12-28 NOTE — ED Notes (Signed)

## 2016-12-28 NOTE — ED Provider Notes (Signed)
[facility] Emergency Department Provider Note   ____________________________________________   I have reviewed the triage vital signs and the nursing notes.   HISTORY  Chief Complaint Flank Pain   History limited by: Not Limited   HPI [name] is a 36 y.o. female who presents to the emergency department today because of right flank pain.   LOCATION:right flank, radiates to groin DURATION:started today TIMING: constant, worsening SEVERITY: severe QUALITY: sharp CONTEXT: patient states she has a history of kidney [place]ce[place].  MODIFYING FACTORS: otc medications have not helped with pain ASSOCIATED SYMPTOMS: no nausea or vomiting. No fevers. Has felt urgency and sense of incomplete void. Some discomfort with urination. Denies any change in color of urine or bad odor.  Per medical record review patient has a history of kidney stones.  Past Medical History:  Diagnosis Date  . Peptic ulcer     There are no active problems to display for this patient.   Past Surgical History:  Procedure Laterality Date  . TONSILECTOMY/ADENOIDECTOMY WITH MYRINGOTOMY    . TONSILLECTOMY    . TUBAL LIGATION    . TYMPANOSTOMY TUBE PLACEMENT      Prior to Admission medications   Medication Sig Start Date End Date Taking? Authorizing Provider  amphetamine-dextroamphetamine (ADDERALL) 20 MG tablet Take 20 mg by mouth daily.    [provider]  ibuprofen (ADVIL,MOTRIN) 600 MG tablet Take 600 mg by mouth every 6 (six) hours as needed.    [provider]  linaclotide (LINZESS) 145 MCG CAPS capsule Take 145 mcg by mouth daily before breakfast.    [provider]  omeprazole (PRILOSEC) 40 MG capsule Take 40 mg by mouth daily.    [provider]  ondansetron (ZOFRAN ODT) 4 MG disintegrating tablet Take 1 tablet (4 mg total) by mouth every 8 (eight) hours as needed for nausea or vomiting. 07/19/15   Gayla DossGayle, Eryka A, MD  ondansetron (ZOFRAN)  4 MG tablet Take 1 tablet (4 mg total) by mouth daily as needed for nausea or vomiting. 10/05/14   Pershing ProudSchaevitz, Myra Rudeavid Matthew, MD  oxyCODONE (ROXICODONE) 5 MG immediate release tablet Take 1 tablet (5 mg total) by mouth every 6 (six) hours as needed for moderate pain. Do not drive while taking this medication. 07/19/15   Gayla DossGayle, Eryka A, MD  tamsulosin (FLOMAX) 0.4 MG CAPS capsule Take 1 capsule (0.4 mg total) by mouth daily. 07/19/15   Gayla DossGayle, Eryka A, MD    Allergies Patient has no known allergies.  No family history on file.  Social History Social History   Tobacco Use  . Smoking status: Never Smoker  . Smokeless tobacco: Never Used  Substance Use Topics  . Alcohol use: No  . Drug use: No    Review of Systems Constitutional: No fever/chills Eyes: No visual changes. ENT: No sore throat. Cardiovascular: Denies chest pain. Respiratory: Denies shortness of breath. Gastrointestinal: Positive for right flank pain.  Genitourinary: Negative for dysuria. Musculoskeletal: Negative for back pain. Skin: Negative for rash. Neurological: Negative for headaches, focal weakness or numbness.  ____________________________________________   PHYSICAL EXAM:  VITAL SIGNS: ED Triage Vitals  Enc Vitals Group     BP 12/28/16 1648 114/76     Pulse Rate 12/28/16 1648 (!) 146     Resp 12/28/16 1648 16     Temp 12/28/16 1648 98.4 F (36.9 C)     Temp Source 12/28/16 1648 Oral     SpO2 12/28/16 1648 100 %  Weight 12/28/16 1648 150 lb (68 kg)     Height 12/28/16 1648 5\' 6"  (1.676 m)     Head Circumference --      Peak Flow --      Pain Score 12/28/16 1651 7   Constitutional: Alert and oriented. Appears uncomfortable. Eyes: Conjunctivae are normal.  ENT   Head: Normocephalic and atraumatic.   Nose: No congestion/rhinnorhea.   Mouth/Throat: Mucous membranes are moist.   Neck: No stridor. Hematological/Lymphatic/Immunilogical: No cervical lymphadenopathy. Cardiovascular: Normal  rate, regular rhythm.  No murmurs, rubs, or gallops.  Respiratory: Normal respiratory effort without tachypnea nor retractions. Breath sounds are clear and equal bilaterally. No wheezes/rales/rhonchi. Gastrointestinal: Soft and non tender. No rebound. No guarding.  Genitourinary: Deferred Musculoskeletal: Normal range of motion in all extremities. No lower extremity edema. Neurologic:  Normal speech and language. No gross focal neurologic deficits are appreciated.  Skin:  Skin is warm, dry and intact. No rash noted. Psychiatric: Mood and affect are normal. Speech and behavior are normal. Patient exhibits appropriate insight and judgment.  ____________________________________________    LABS (pertinent positives/negatives)  Upreg neg UA small urine BMP wnl CBC wnl  ____________________________________________   EKG  None  ____________________________________________    RADIOLOGY  None  ____________________________________________   PROCEDURES  Procedures  ____________________________________________   INITIAL IMPRESSION / ASSESSMENT AND PLAN / ED COURSE  Pertinent labs & imaging results that were available during my care of the patient were reviewed by me and considered in my medical decision making (see chart for details).  Patient presented to the emergency department today because of right flank pain. ddx would include ectopic, ovarian pathology, appendicitis, kidney Scherer, pyelonephritis amongst other etiologies. Patient does have history of kidney Mclees and states that this feels similar to her previous episode. No fever or leukocytosis to suggest appendicitis. Urine did have some blood in it. Patient felt better after IV lidocaine. At this point I think patient's presentation and exam most consistent with kidney Dement. No signs of kidney failure or infection. Discussed acquiring imaging with patient at this time, however given lack of other concerning findings  patient felt comfortable deferring at this time. She still has strainer from previous kidney Maynor. Discussed return precautions with the patient.   ____________________________________________   FINAL CLINICAL IMPRESSION(S) / ED DIAGNOSES  Final diagnoses:  Right flank pain  Kidney Schwertner     Note: This dictation was prepared with Dragon dictation. Any transcriptional errors that result from this process are unintentional     Phineas SemenGoodman, Viet Kemmerer, MD 12/28/16 2059

## 2016-12-28 NOTE — Discharge Instructions (Signed)
Please seek medical attention for any high fevers, chest pain, shortness of breath, change in behavior, persistent vomiting, bloody stool or any other new or concerning symptoms.  

## 2016-12-28 NOTE — ED Triage Notes (Signed)
Pt c/o right flank pain that started this morning. Pt states that the pain radiates into her right groin. Pt states that she has had increased urination and feels like when she passes urine it is only a small amount. Pt denies N/V, fever or chills.

## 2016-12-28 NOTE — ED Notes (Signed)
Verified Cardiac Lidocaine HCL order for patient for kidney Sayas pain with ED provider.

## 2017-12-17 IMAGING — CT CT ABD-PELV W/O CM
3 of 4 series · 9 of 46 positions shown, 16 images · non-contrast
Comparison: 12/15/2009 CT

CLINICAL DATA: 35-year-old male with acute right flank and
abdominal pain with associated nausea for 1 day.

EXAM:
CT ABDOMEN AND PELVIS WITHOUT CONTRAST
TECHNIQUE: Multidetector CT imaging of the abdomen and pelvis was performed
following the standard protocol without IV contrast.

[Series 4: lung · axial · 0.70mm/px · z∈[-150,-70]mm · 5 of 26 slices shown, 10 images]
[im 5/26  soft-tissue]
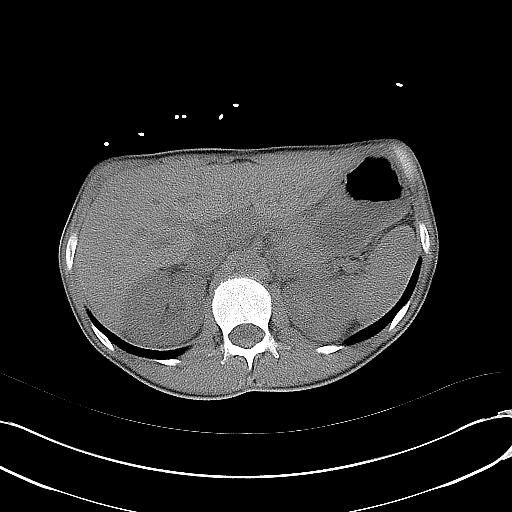
[im 5/26  bone]
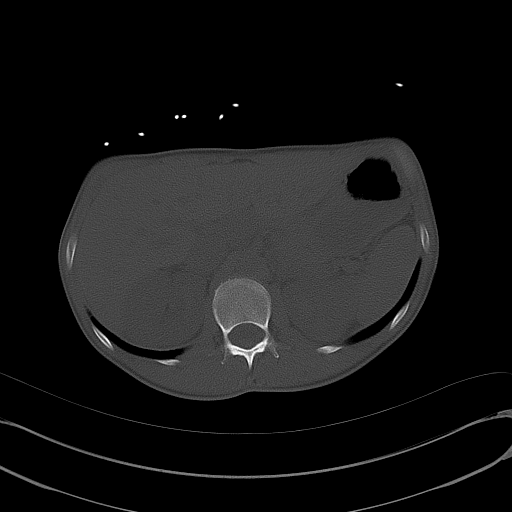
[im 9/26  soft-tissue]
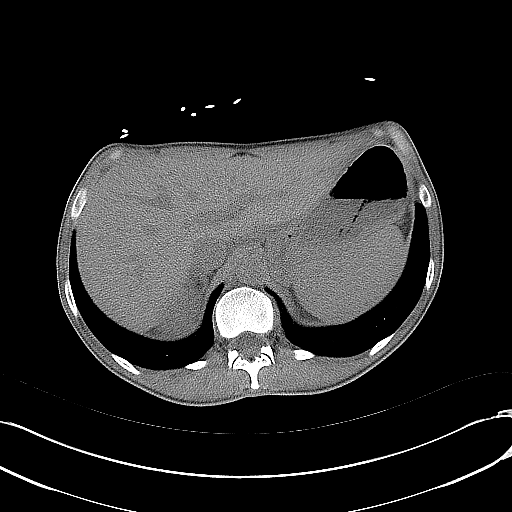
[im 9/26  lung]
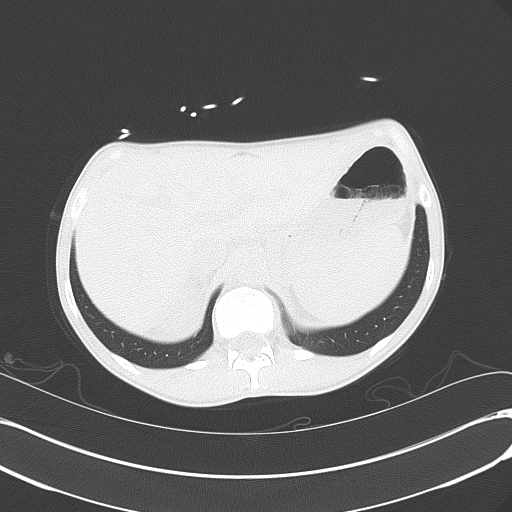
[im 13/26  soft-tissue]
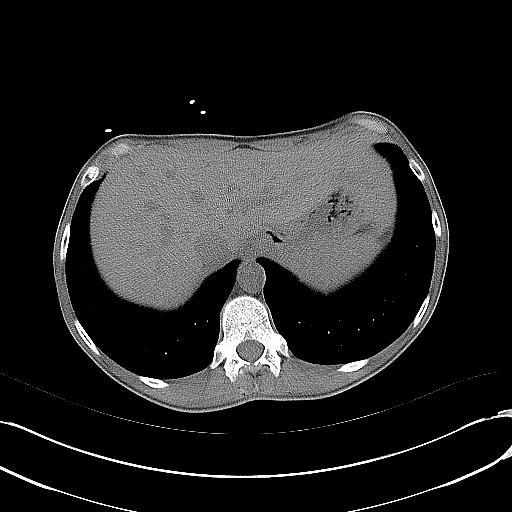
[im 13/26  lung]
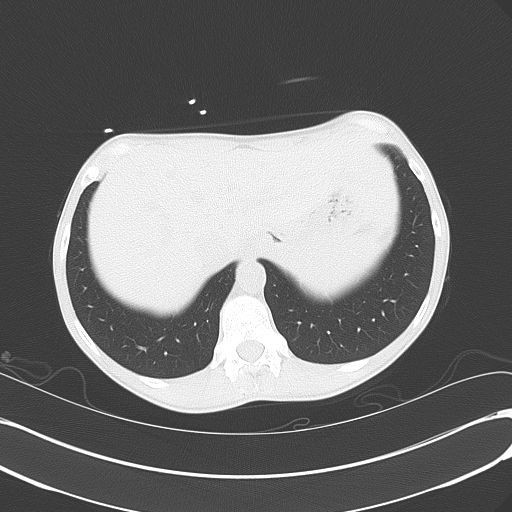
[im 17/26  soft-tissue]
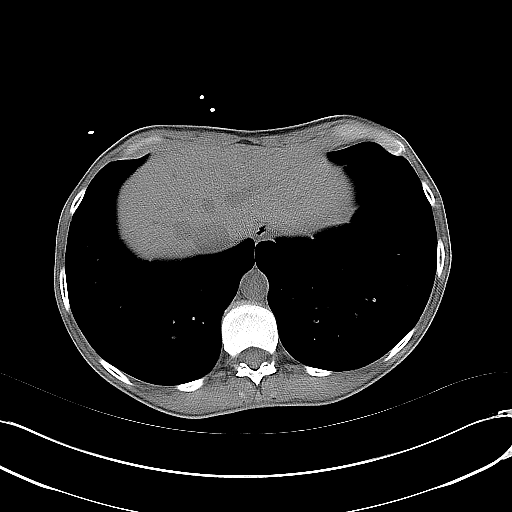
[im 17/26  lung]
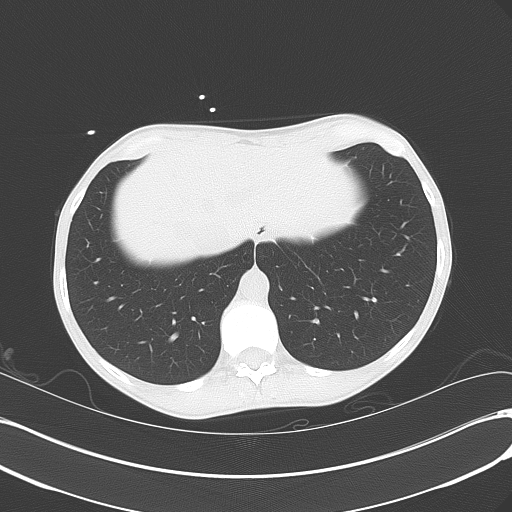
[im 21/26  soft-tissue]
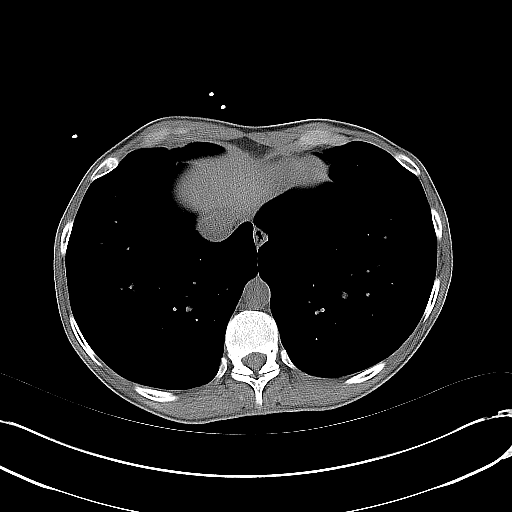
[im 21/26  lung]
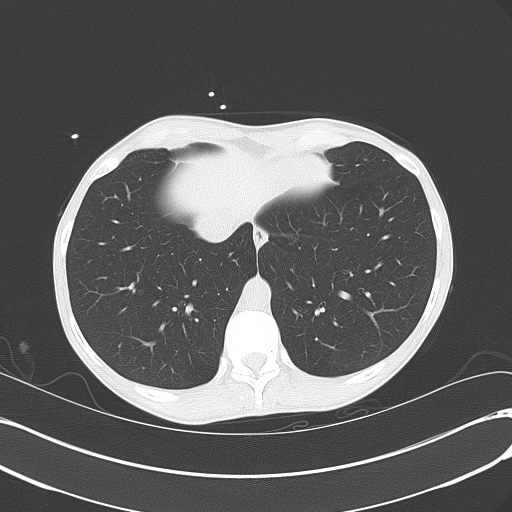

[Series 5: coronal · coronal · 0.74mm/px · 3 of 117 slices shown, 4 images]
[im 39/117  soft-tissue]
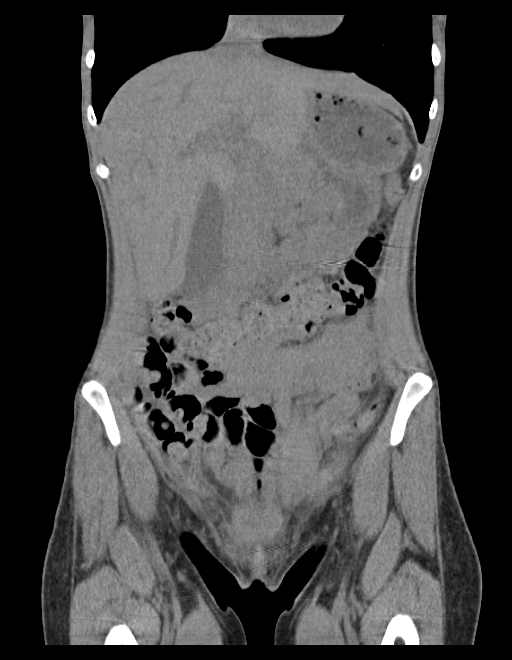
[im 52/117  soft-tissue]
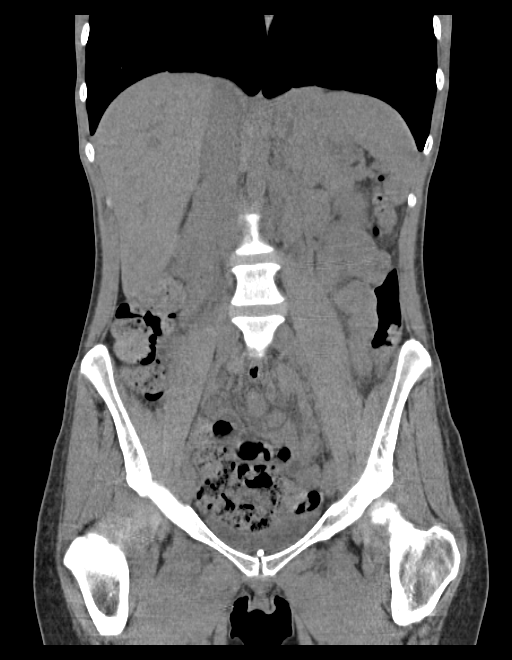
[im 52/117  bone]
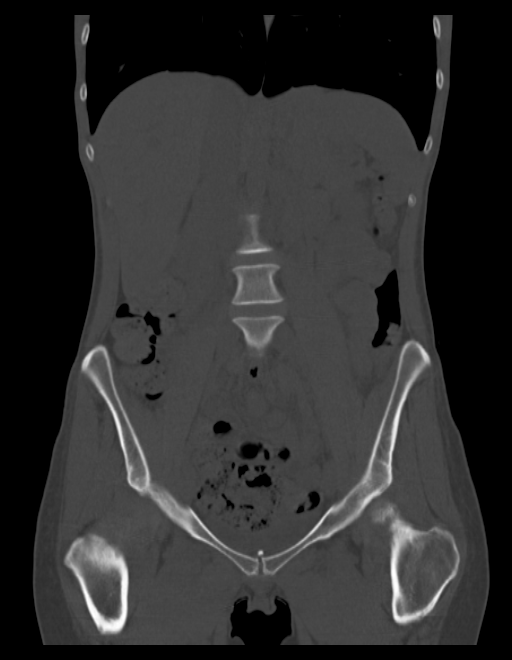
[im 65/117  soft-tissue]
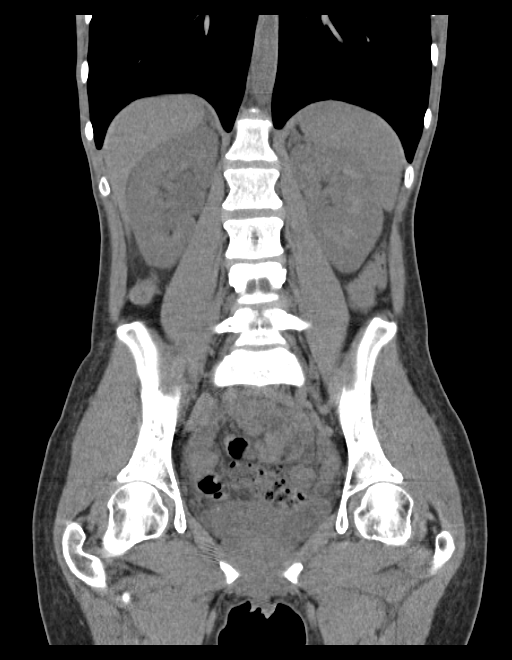

[Series 6: sagittal · sagittal · 0.51mm/px · 1 of 164 slices shown, 2 images]
[im 55/164  soft-tissue]
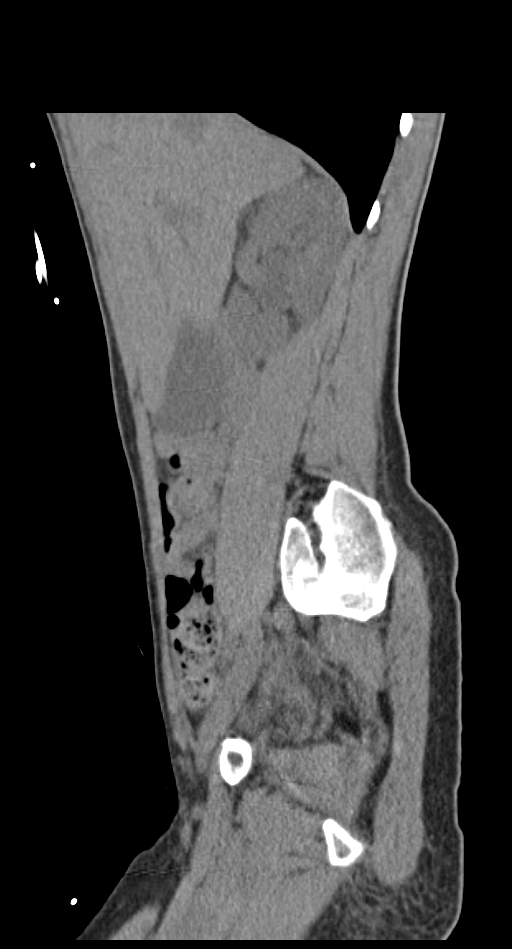
[im 55/164  bone]
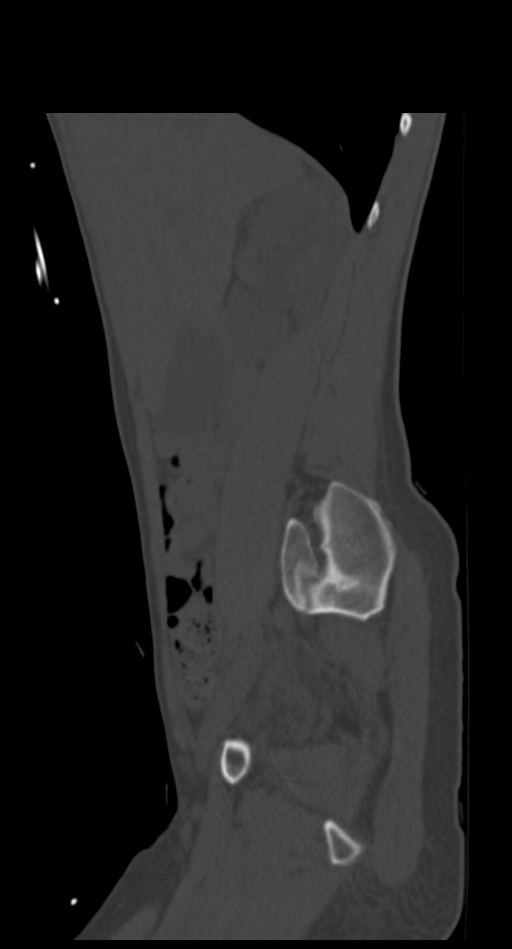

[9 of 46 positions shown; findings below may reference images not displayed]

FINDINGS: Please note that parenchymal abnormalities may be missed without
intravenous contrast.

Lower chest:  Unremarkable

Hepatobiliary: The liver and gallbladder are unremarkable. There is
no evidence of biliary dilatation.

Pancreas: Unremarkable

Spleen: Unremarkable

Adrenals/Urinary Tract: A 1.5 mm right UVJ calculus causes mild
right hydroureteronephrosis. No other renal abnormalities are noted.
The adrenal glands are unremarkable.

Stomach/Bowel: Unremarkable.

Vascular/Lymphatic: Unremarkable

Reproductive: Unremarkable

Other: No free fluid, focal collection or pneumoperitoneum.

Musculoskeletal: No acute or suspicious abnormality noted.
IMPRESSION: 1.5 mm right UVJ calculus causing mild right hydroureteronephrosis.
